# Patient Record
Sex: Male | Born: 1955 | Race: Black or African American | Hispanic: No | Smoking: Never smoker
Health system: Southern US, Community
[De-identification: ages and names within clinical notes are randomized; demographics above are authoritative.]

## PROBLEM LIST (undated history)

## (undated) DIAGNOSIS — M4800 Spinal stenosis, site unspecified: Secondary | ICD-10-CM

## (undated) DIAGNOSIS — M47816 Spondylosis without myelopathy or radiculopathy, lumbar region: Secondary | ICD-10-CM

## (undated) DIAGNOSIS — I10 Essential (primary) hypertension: Secondary | ICD-10-CM

## (undated) HISTORY — PX: CERVICAL FUSION: SHX112

---

## 1999-11-28 ENCOUNTER — Emergency Department (HOSPITAL_COMMUNITY): Admission: EM | Admit: 1999-11-28 | Discharge: 1999-11-28 | Payer: Self-pay | Admitting: Emergency Medicine

## 1999-11-28 ENCOUNTER — Encounter: Payer: Self-pay | Admitting: Emergency Medicine

## 1999-12-10 ENCOUNTER — Ambulatory Visit (HOSPITAL_COMMUNITY): Admission: RE | Admit: 1999-12-10 | Discharge: 1999-12-10 | Payer: Self-pay | Admitting: Family Medicine

## 1999-12-10 ENCOUNTER — Encounter: Payer: Self-pay | Admitting: Family Medicine

## 2000-04-17 ENCOUNTER — Encounter: Payer: Self-pay | Admitting: Family Medicine

## 2000-04-17 ENCOUNTER — Encounter: Admission: RE | Admit: 2000-04-17 | Discharge: 2000-04-17 | Payer: Self-pay | Admitting: Family Medicine

## 2000-09-17 ENCOUNTER — Ambulatory Visit (HOSPITAL_COMMUNITY): Admission: RE | Admit: 2000-09-17 | Discharge: 2000-09-17 | Payer: Self-pay | Admitting: *Deleted

## 2001-01-01 ENCOUNTER — Ambulatory Visit (HOSPITAL_COMMUNITY): Admission: RE | Admit: 2001-01-01 | Discharge: 2001-01-01 | Payer: Self-pay | Admitting: *Deleted

## 2013-09-08 DIAGNOSIS — M4802 Spinal stenosis, cervical region: Secondary | ICD-10-CM | POA: Insufficient documentation

## 2013-09-09 DIAGNOSIS — Z981 Arthrodesis status: Secondary | ICD-10-CM | POA: Insufficient documentation

## 2015-04-06 DIAGNOSIS — I10 Essential (primary) hypertension: Secondary | ICD-10-CM | POA: Insufficient documentation

## 2015-11-28 ENCOUNTER — Other Ambulatory Visit: Payer: Self-pay | Admitting: Orthopaedic Surgery

## 2015-11-28 DIAGNOSIS — M48061 Spinal stenosis, lumbar region without neurogenic claudication: Secondary | ICD-10-CM

## 2015-12-13 ENCOUNTER — Ambulatory Visit
Admission: RE | Admit: 2015-12-13 | Discharge: 2015-12-13 | Disposition: A | Discharge status: Home / Self Care | Payer: Medicare Other | Source: Ambulatory Visit | Attending: Orthopaedic Surgery | Admitting: Orthopaedic Surgery

## 2015-12-13 DIAGNOSIS — M48061 Spinal stenosis, lumbar region without neurogenic claudication: Secondary | ICD-10-CM

## 2015-12-23 ENCOUNTER — Other Ambulatory Visit: Payer: Self-pay

## 2016-01-13 ENCOUNTER — Other Ambulatory Visit: Payer: Self-pay

## 2017-02-20 IMAGING — MR MR LUMBAR SPINE W/O CM
4 of 5 series · 19 of 48 positions shown · non-contrast
Comparison: None.

CLINICAL DATA: Low back pain with pain, numbness and weakness in
both legs for more than 1 year. No acute injury or prior relevant
surgery.

EXAM:
MRI LUMBAR SPINE WITHOUT CONTRAST
TECHNIQUE: Multiplanar, multisequence MR imaging of the lumbar spine was
performed. No intravenous contrast was administered.

[Series 5: T2 · sagittal · 4.0mm · 0.68mm/px · 7 of 15 slices shown (1 of 2)]
[im 1/15]
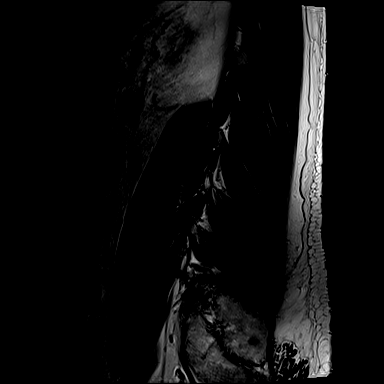
[im 3/15]
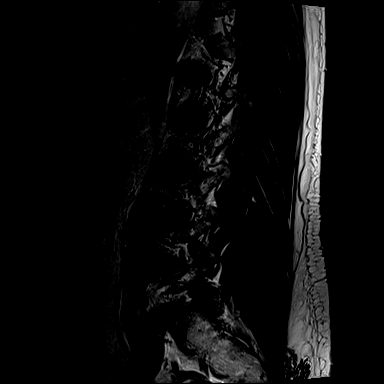
[im 5/15]
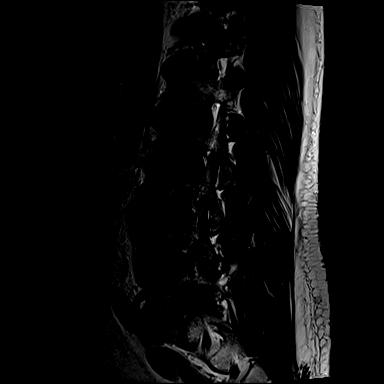
[im 8/15]
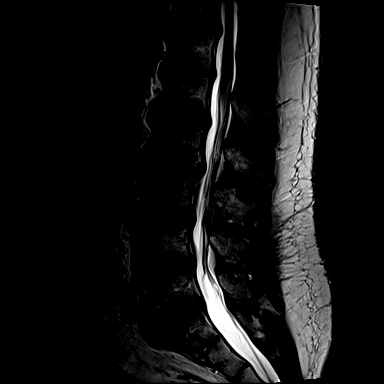
[im 10/15]
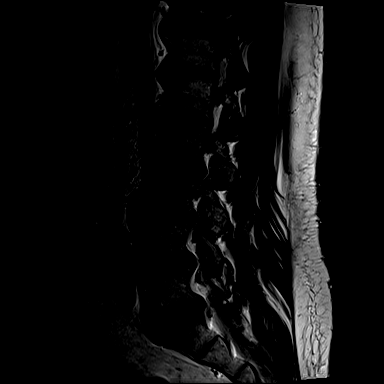
[im 12/15]
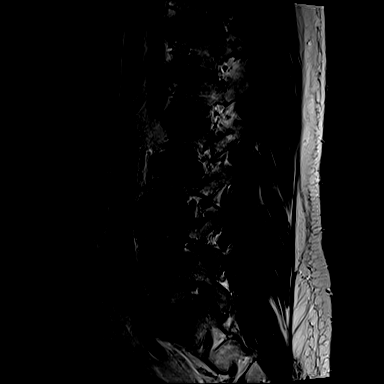
[im 15/15]
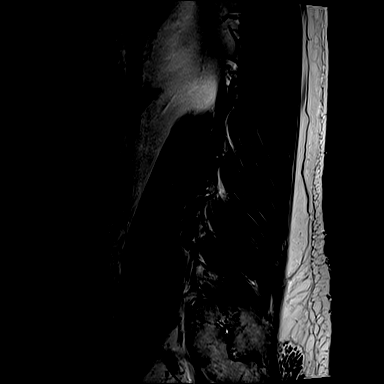

[Series 6: T1 · sagittal · 4.0mm · 0.81mm/px · 3 of 15 slices shown (1 of 2)]
[im 3/15]
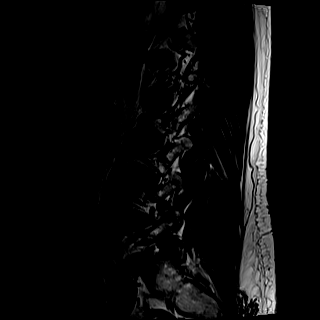
[im 8/15]
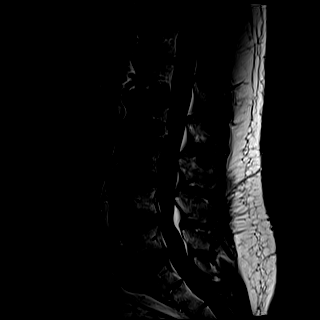
[im 12/15]
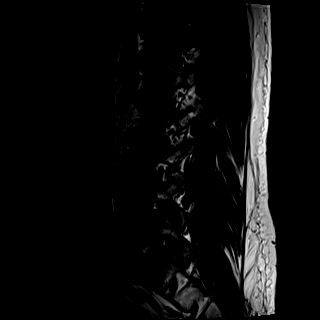

[Series 10: T1 · axial · 4.0mm · 0.28mm/px · z∈[-83,+45]mm · 3 of 33 slices shown (2 of 2)]
[im 5/33]
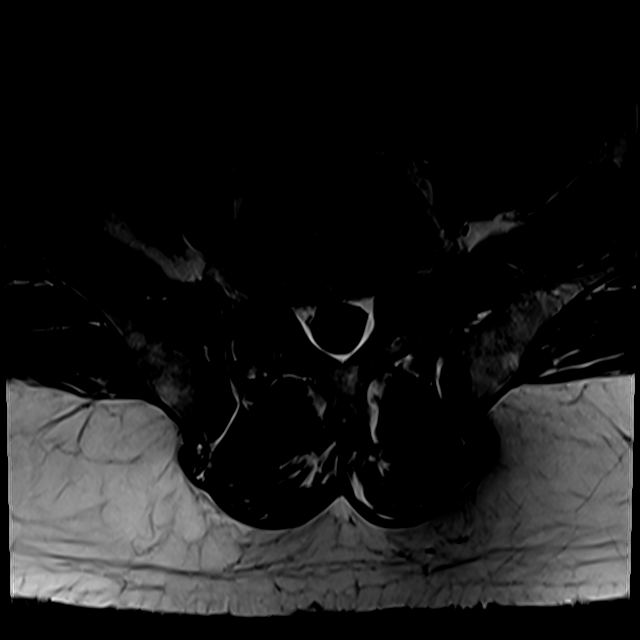
[im 18/33]
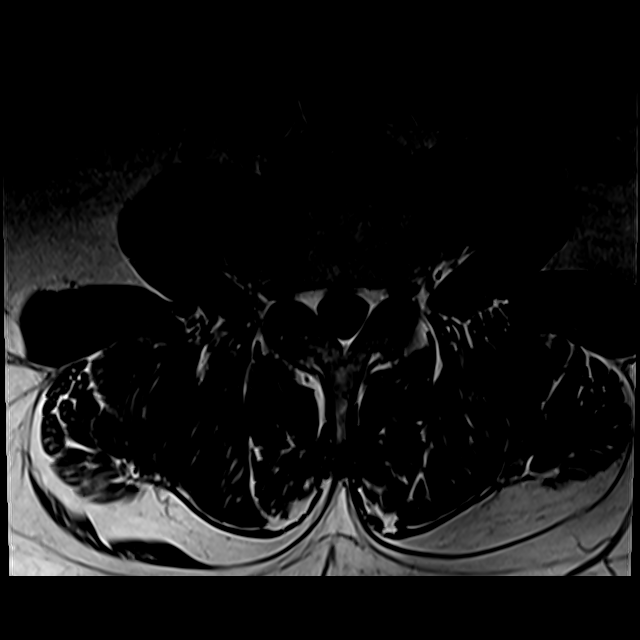
[im 28/33]
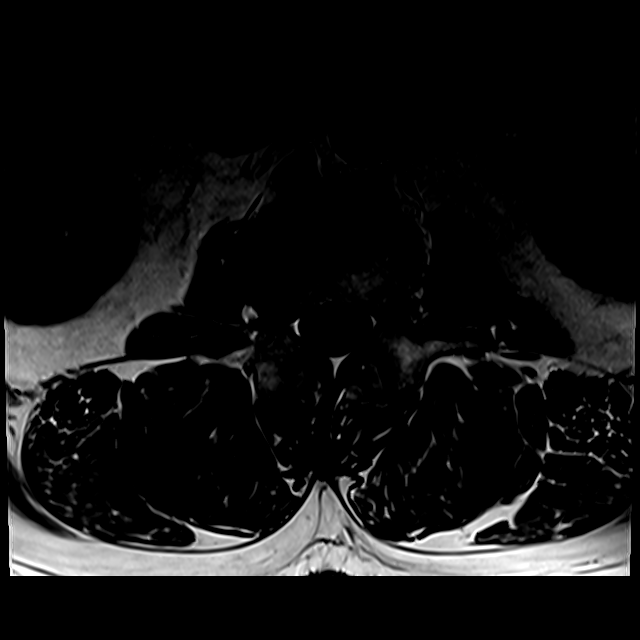

[Series 13: T2 · axial · 4.0mm · 0.28mm/px · z∈[-103,+45]mm · 6 of 33 slices shown (2 of 2)]
[im 1/33]
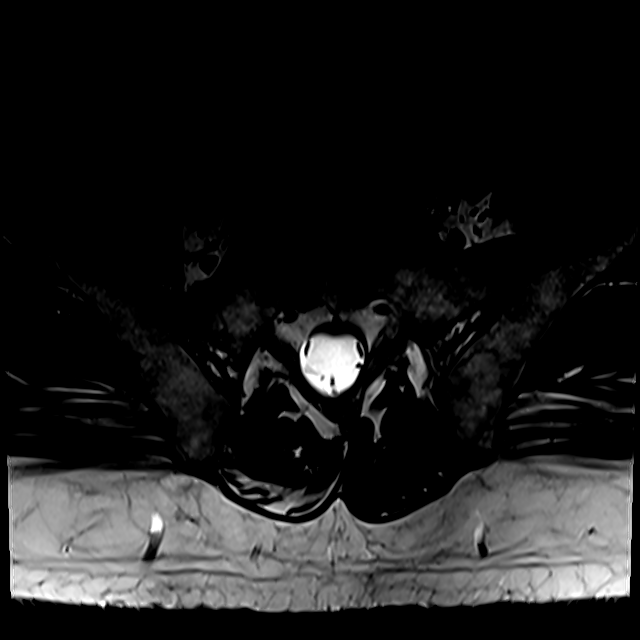
[im 5/33]
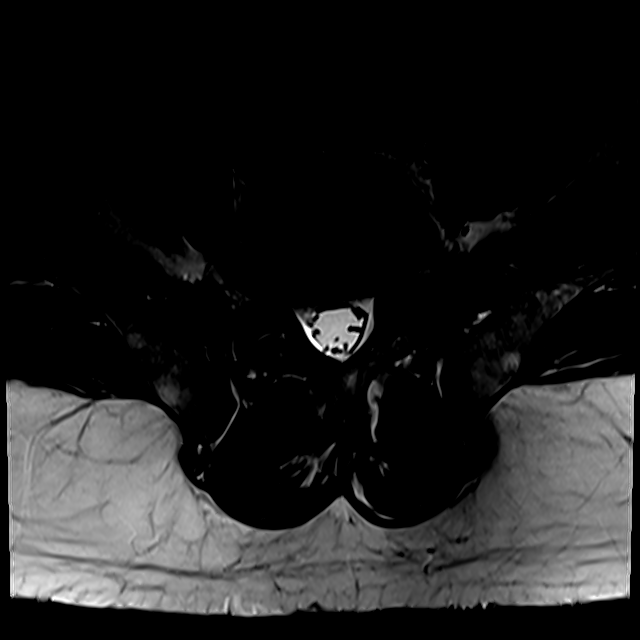
[im 10/33]
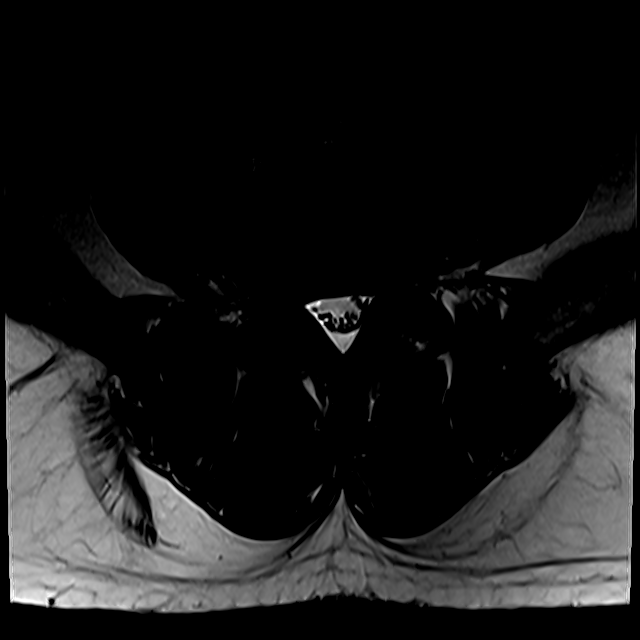
[im 15/33]
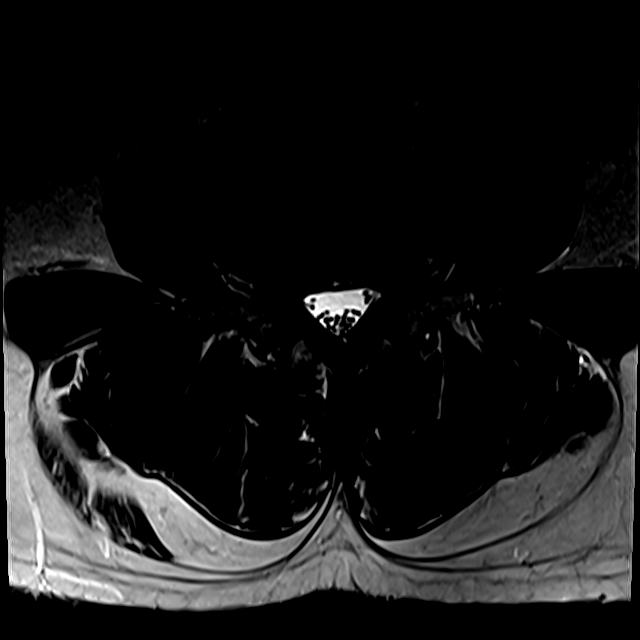
[im 18/33]
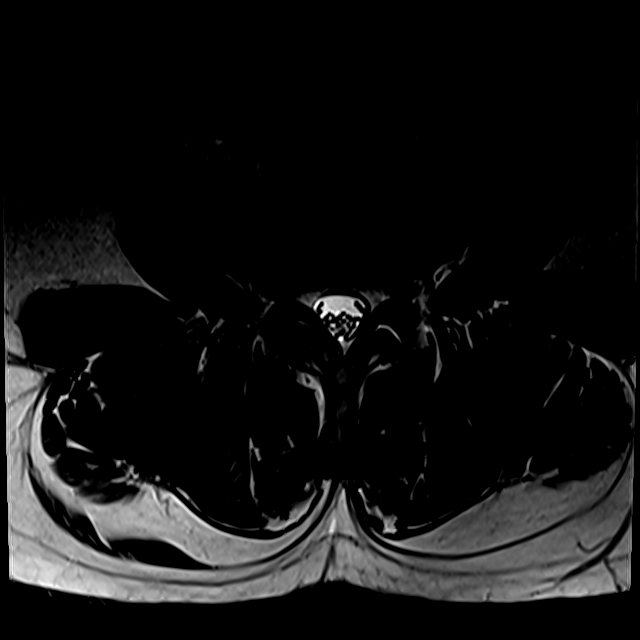
[im 28/33]
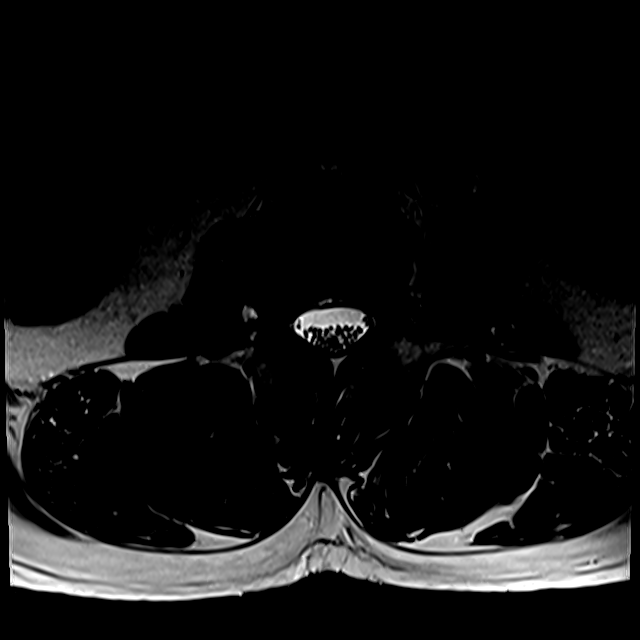

[19 of 48 positions shown; findings below may reference images not displayed]

FINDINGS: Segmentation: Conventional anatomy assumed, with the last open disc
space designated L5-S1.

Alignment: Minimal convex left scoliosis. No focal angulation or
significant listhesis.

Vertebrae: No worrisome osseous lesion, acute fracture or pars
defect. There are endplate degenerative changes which are most
advanced at L1-2 and L2-3. The lumbar pedicles are diffusely short
on a congenital basis. The left sacroiliac joint appears partially
ankylosed by anterior bridging osteophytes. The right sacroiliac
joint is not visualized.

Conus medullaris: Extends to the L1-2 level and appears normal.

Paraspinal and other soft tissues: No significant paraspinal
findings.

Disc levels:

Sagittal images demonstrate disc bulging with anterior osteophytes
at T11-12 and T12-L1. There is no cord deformity or significant
foraminal compromise.

L1-2: Mild loss of disc height with annular disc bulging and
endplate osteophytes. No significant spinal stenosis or nerve root
encroachment.

L2-3: There is loss of disc height with annular disc bulging and
endplate osteophytes. No significant spinal stenosis or nerve root
encroachment.

L3-4: Mild loss of disc height with annular disc bulging and
endplate osteophytes. Mild facet and ligamentous hypertrophy. There
is mild narrowing of the foramina without definite nerve root
encroachment.

L4-5: Mild disc bulging with preserved disc height. There is mild
facet and ligamentous hypertrophy. These factors contribute to mild
foraminal narrowing, left greater than right.

L5-S1: Moderate disc bulging with preserved disc height. Mild facet
and ligamentous hypertrophy. These factors contribute to mild right
and moderate left foraminal narrowing.
IMPRESSION: 1. There is multilevel degenerative disc disease superimposed on a
congenitally small canal. No significant central spinal stenosis
demonstrated.
2. Disc bulging and facet hypertrophy contribute to
left-greater-than-right foraminal narrowing inferiorly, worst on the
left at L5-S1.
3. No definite acute findings.

## 2017-11-11 ENCOUNTER — Ambulatory Visit (INDEPENDENT_AMBULATORY_CARE_PROVIDER_SITE_OTHER): Payer: Self-pay | Admitting: Orthopaedic Surgery

## 2020-02-17 DIAGNOSIS — M47816 Spondylosis without myelopathy or radiculopathy, lumbar region: Secondary | ICD-10-CM | POA: Insufficient documentation

## 2020-04-05 ENCOUNTER — Telehealth: Payer: Self-pay | Admitting: Specialist

## 2020-04-05 NOTE — Telephone Encounter (Signed)
Patient would like to see Dr.Nitka for his back.  Please review and advise

## 2020-04-06 NOTE — Telephone Encounter (Signed)
This patient would like to see you, but he has seen Dr. Ophelia Charter (Last OV was 01/17/2016--in [facility]) but his brother Matson Welch would like him to see you.  As far as it looks in the system he has not any recent imaging or care.  His last MRI that we have was done 12/13/2015. --Please advise

## 2020-04-07 ENCOUNTER — Ambulatory Visit (INDEPENDENT_AMBULATORY_CARE_PROVIDER_SITE_OTHER): Payer: Medicare Other

## 2020-04-07 ENCOUNTER — Encounter: Payer: Self-pay | Admitting: Specialist

## 2020-04-07 ENCOUNTER — Other Ambulatory Visit: Payer: Self-pay

## 2020-04-07 ENCOUNTER — Ambulatory Visit (INDEPENDENT_AMBULATORY_CARE_PROVIDER_SITE_OTHER): Payer: Medicare Other | Admitting: Specialist

## 2020-04-07 VITALS — BP 143/88 | HR 87 | Ht 70.5 in | Wt 235.0 lb

## 2020-04-07 DIAGNOSIS — M545 Low back pain, unspecified: Secondary | ICD-10-CM | POA: Diagnosis not present

## 2020-04-07 DIAGNOSIS — M5136 Other intervertebral disc degeneration, lumbar region: Secondary | ICD-10-CM

## 2020-04-07 DIAGNOSIS — Q761 Klippel-Feil syndrome: Secondary | ICD-10-CM

## 2020-04-07 DIAGNOSIS — M48062 Spinal stenosis, lumbar region with neurogenic claudication: Secondary | ICD-10-CM

## 2020-04-07 DIAGNOSIS — M4726 Other spondylosis with radiculopathy, lumbar region: Secondary | ICD-10-CM

## 2020-04-07 MED ORDER — AMITRIPTYLINE HCL 10 MG PO TABS: 10.0000 mg | ORAL_TABLET | Freq: Every day | ORAL | 0 refills | Status: DC

## 2020-04-07 MED ORDER — DICLOFENAC POTASSIUM 50 MG PO TABS: ORAL_TABLET | ORAL | 0 refills | Status: AC

## 2020-04-07 NOTE — Patient Instructions (Signed)
Plan: Avoid bending, stooping and avoid lifting weights greater than 10 lbs. Avoid prolong standing and walking. Avoid frequent bending and stooping  No lifting greater than 10 lbs. May use ice or moist heat for pain. Weight loss is of benefit. Handicap license is approved. Dr. Kosse Blas secretary/Assistant will call to arrange for epidural steroid injection  Diclofenac 50 mg po daily for one week then  BID with meal or snack. Amitriptyline 10 mg qhs Follow-Up Instructions: No follow-ups on file.

## 2020-04-07 NOTE — Progress Notes (Addendum)
Office Visit Note   Patient: Darin Hernandez           Date of Birth: [birth year]           MRN: [id number] Visit Date: 04/07/2020              Requested by: No referring provider defined for this encounter. PCP: No primary care provider on file.   Assessment & Plan: Visit Diagnoses:  1. Low back pain, unspecified back pain laterality, unspecified chronicity, unspecified whether sciatica present   2. Spinal stenosis of lumbar region with neurogenic claudication   3. Other spondylosis with radiculopathy, lumbar region   4. Degenerative disc disease, lumbar   5. 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 He has knife like pain into the right upper buttock that is  At times continuous. MRI shows impressive upper lumbar and lower thoracic DDD with congenital narrowing of the foramen due to shortened pedicles. There is severe foramenal narrowing present right L4-5 and L5-S1 due to disc bulge, overlying facet arthrosis. He points to the lower half of the lumbar spine and more in the small of his back as the primary area of pain with right PSIS pain that can be more typical of L5 nerve entrapment. Motor in sitting position is normal. Has relief that is only temporary with steroid dose pak. Will start diclofenac. He has already taken gabapentin and lyrica in the past without relief, I will add amitriptyline 10 mg at night. Advised to use a  Stationary bike and avoid prolong standing and avoid walking for exercise, may use a cane, pool exercise and walking may be better tolerated. Will refer for transforamenal ESI right L4 and L5.  Plan: Avoid bending, stooping and avoid lifting weights greater than 10 lbs. Avoid prolong standing and walking. Avoid frequent bending and stooping  No lifting  greater than 10 lbs. May use ice or moist heat for pain. Weight loss is of benefit. Handicap license is approved. Dr. Lehigh Blas secretary/Assistant will call to arrange for epidural steroid injection  Diclofenac 50 mg po daily for one week then  BID with meal or snack. Amitriptyline 10 mg qhs Follow-Up Instructions: Return in about 4 weeks (around 05/05/2020).   Orders:  Orders Placed This Encounter  Procedures  . XR Lumbar Spine 2-3 Views  . Ambulatory referral to Physical Medicine Rehab   No orders of the defined types were placed in this encounter.     Procedures: No procedures performed   Clinical Data: No additional findings.   Subjective: Chief Complaint  Patient presents with  . 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 He has been having back pain for about 5 years. Pain is a 24 of 10. Pain is sharp and like a knife. No memory of back pain or injury doing work. He is having pain now even with lying down. Pain is made worse with twisting or bending or lifting heavy items.  Raking or sweeping or mopping increases the pain. Just going to get up is painful. He has gained a few pound. Not a smoker. He has seen Dr. Ophelia Charter about his back  but he does not recall if there was anything he could do. He lives in [place] near [place], [place] and [place], [place]. He had  An MRI of his back in [facility] in [place] in 01/2020. He saw a neurosurgeon in [place] that told him that he couldn't help him. He went to pain management prior to neck condition, he had rods and screws placed but still hurts. No bowel or bladder issues. He has pain with walking and standing. He can not walk a long way without pain. Has difficulty reaching his shoes and socks, manages to reach them. He has difficulty going up and down stairs. He has to wait a moment or two when he first stands to be  able to walk.  He finds a place to sit when he stands or walks too long.   Review of Systems  Constitutional: Negative.  Negative for activity change, appetite change, chills (with bad pain), diaphoresis, fatigue, fever and unexpected weight change.  HENT: Positive for tinnitus. Negative for congestion, dental problem, drooling, ear discharge, ear pain, facial swelling, hearing loss, mouth sores, nosebleeds, postnasal drip, rhinorrhea, sinus pressure, sinus pain, sneezing, sore throat, trouble swallowing and voice change.   Eyes: Positive for visual disturbance. Negative for photophobia, pain, discharge, redness and itching.  Respiratory: Negative.  Negative for apnea, cough, choking, chest tightness, shortness of breath, wheezing and stridor.   Cardiovascular: Negative.  Negative for chest pain, palpitations and leg swelling.  Gastrointestinal: Negative.  Negative for abdominal distention, abdominal pain, anal bleeding, blood in stool, constipation, diarrhea, nausea, rectal pain and vomiting.  Endocrine: Negative for cold intolerance and heat intolerance.  Genitourinary: Negative.   Musculoskeletal: Positive for arthralgias, back pain and gait problem. Negative for joint swelling, myalgias, neck pain and neck stiffness.  Skin: Negative.  Negative for color change, pallor, rash and wound.  Allergic/Immunologic: Negative.  Negative for environmental allergies, food allergies and immunocompromised state.  Neurological: Positive for weakness and numbness. Negative for dizziness, tremors, seizures, syncope, facial asymmetry, speech difficulty, light-headedness and headaches.  Hematological: Negative.  Negative for adenopathy. Does not bruise/bleed easily.  Psychiatric/Behavioral: Negative.  Negative for agitation, behavioral problems, confusion, decreased concentration, dysphoric mood, hallucinations, self-injury, sleep disturbance and suicidal ideas. The patient is not nervous/anxious and is not  hyperactive.      Objective: Vital Signs: BP (!) 143/88 (BP Location: Left Arm, Patient Position: Sitting)   Pulse 87   Ht 5' 10.5" (1.791 m)   Wt 235 lb (106.6 kg)   BMI 33.24 kg/m   Physical Exam Constitutional:      Appearance: He is well-developed.  HENT:     Head: Normocephalic and atraumatic.  Eyes:     Pupils: Pupils are equal, round, and reactive to light.  Pulmonary:     Effort: Pulmonary effort is normal.     Breath sounds: Normal breath sounds.  Abdominal:     General: Bowel sounds are normal.     Palpations: Abdomen is soft.  Musculoskeletal:        General: Normal range of motion.     Cervical back: Normal range of motion and neck supple.  Skin:    General: Skin is warm and dry.  Neurological:     Mental Status: He is alert and oriented to person, place, and time.  Psychiatric:        Behavior: Behavior normal.        Thought Content: Thought content normal.        Judgment:  Judgment normal.     Ortho Exam  Specialty Comments:  No specialty comments available.  Imaging: No results found.   PMFS History: There are no problems to display for this patient.  No past medical history on file.  No family history on file.   Social History   Occupational History  . Not on file  Tobacco Use  . Smoking status: Never Smoker  . Smokeless tobacco: Never Used  Vaping Use  . Vaping Use: Never assessed  Substance and Sexual Activity  . Alcohol use: Yes    Alcohol/week: 4.0 standard drinks    Types: 4 Cans of beer per week    Comment: mostly on weekends.  . Drug use: Never  . Sexual activity: Not on file

## 2020-04-07 NOTE — Addendum Note (Signed)
Addended by: Vira Browns on: 04/07/2020 01:42 PM   Modules accepted: Orders

## 2020-05-01 ENCOUNTER — Ambulatory Visit (INDEPENDENT_AMBULATORY_CARE_PROVIDER_SITE_OTHER): Payer: Medicare Other | Admitting: Physical Medicine and Rehabilitation

## 2020-05-01 ENCOUNTER — Encounter: Payer: Self-pay | Admitting: Physical Medicine and Rehabilitation

## 2020-05-01 ENCOUNTER — Other Ambulatory Visit: Payer: Self-pay

## 2020-05-01 ENCOUNTER — Ambulatory Visit: Payer: Self-pay

## 2020-05-01 VITALS — BP 152/92 | HR 76

## 2020-05-01 DIAGNOSIS — M5416 Radiculopathy, lumbar region: Secondary | ICD-10-CM | POA: Diagnosis not present

## 2020-05-01 MED ORDER — METHYLPREDNISOLONE ACETATE 80 MG/ML IJ SUSP
80.0000 mg | Freq: Once | INTRAMUSCULAR | Status: AC
  Administered 2020-05-01: 80 mg

## 2020-05-01 NOTE — Progress Notes (Signed)
Darin Hernandez - 64 y.o. male MRN [id number]  Date of birth: 1956-04-04  Office Visit Note: Visit Date: 05/01/2020 PCP: No primary care provider on file. Referred by: Kerrin Champagne, MD  Subjective: Chief Complaint  Patient presents with  . Lower Back - Pain   HPI:  Darin Hernandez is a 64 y.o. male who comes in today at the request of Dr. Vira Browns for planned Right L4-L5 and L5-S1 Lumbar epidural steroid injection with fluoroscopic guidance.  The patient has failed conservative care including home exercise, medications, time and activity modification.  This injection will be diagnostic and hopefully therapeutic.  Please see requesting physician notes for further details and justification.   ROS Otherwise per HPI.  Assessment & Plan: Visit Diagnoses:  1. Lumbar radiculopathy     Plan: No additional findings.   Meds & Orders:  Meds ordered this encounter  Medications  . methylPREDNISolone acetate (DEPO-MEDROL) injection 80 mg    Orders Placed This Encounter  Procedures  . XR C-ARM NO REPORT  . Epidural Steroid injection    Follow-up: Return for visit to requesting physician as needed.   Procedures: No procedures performed  Lumbosacral Transforaminal Epidural Steroid Injection - Sub-Pedicular Approach with Fluoroscopic Guidance  Patient: Darin Hernandez      Date of Birth: 04/02/1956 MRN: [id number] PCP: No primary care provider on file.      Visit Date: 05/01/2020   Universal Protocol:    Date/Time: 05/01/2020  Consent Given By: the patient  Position: PRONE  Additional Comments: Vital signs were monitored before and after the procedure. Patient was prepped and draped in the usual sterile fashion. The correct patient, procedure, and site was verified.   Injection Procedure Details:   Procedure diagnoses:  1. Lumbar radiculopathy      Meds Administered:  Meds ordered this encounter  Medications  . methylPREDNISolone acetate (DEPO-MEDROL) injection 80 mg     Laterality: Right  Location/Site:  L4-L5 L5-S1  Needle:5.0 in., 22 ga.  Short bevel or Quincke spinal needle  Needle Placement: Transforaminal  Findings:    -Comments: Excellent flow of contrast along the nerve, nerve root and into the epidural space.  Procedure Details: After squaring off the end-plates to get a true AP view, the C-arm was positioned so that an oblique view of the foramen as noted above was visualized. The target area is just inferior to the "nose of the scotty dog" or sub pedicular. The soft tissues overlying this structure were infiltrated with 2-3 ml. of 1% Lidocaine without Epinephrine.  The spinal needle was inserted toward the target using a "trajectory" view along the fluoroscope beam.  Under AP and lateral visualization, the needle was advanced so it did not puncture dura and was located close the 6 O'Clock position of the pedical in AP tracterory. Biplanar projections were used to confirm position. Aspiration was confirmed to be negative for CSF and/or blood. A 1-2 ml. volume of Isovue-250 was injected and flow of contrast was noted at each level. Radiographs were obtained for documentation purposes.   After attaining the desired flow of contrast documented above, a 0.5 to 1.0 ml test dose of 0.25% Marcaine was injected into each respective transforaminal space.  The patient was observed for 90 seconds post injection.  After no sensory deficits were reported, and normal lower extremity motor function was noted,   the above injectate was administered so that equal amounts of the injectate were placed at each foramen (level) into the  transforaminal epidural space.   Additional Comments:  The patient tolerated the procedure well Dressing: 2 x 2 sterile gauze and Band-Aid    Post-procedure details: Patient was observed during the procedure. Post-procedure instructions were reviewed.  Patient left the clinic in stable condition.      Clinical  History: MRI LUMBAR SPINE WITHOUT CONTRAST  TECHNIQUE: Multiplanar, multisequence MR imaging of the lumbar spine was performed. No intravenous contrast was administered.  COMPARISON:  None.  FINDINGS: Segmentation: Conventional anatomy assumed, with the last open disc space designated L5-S1.  Alignment: Minimal convex left scoliosis. No focal angulation or significant listhesis.  Vertebrae: No worrisome osseous lesion, acute fracture or pars defect. There are endplate degenerative changes which are most advanced at L1-2 and L2-3. The lumbar pedicles are diffusely short on a congenital basis. The left sacroiliac joint appears partially ankylosed by anterior bridging osteophytes. The right sacroiliac joint is not visualized.  Conus medullaris: Extends to the L1-2 level and appears normal.  Paraspinal and other soft tissues: No significant paraspinal findings.  Disc levels:  Sagittal images demonstrate disc bulging with anterior osteophytes at T11-12 and T12-L1. There is no cord deformity or significant foraminal compromise.  L1-2: Mild loss of disc height with annular disc bulging and endplate osteophytes. No significant spinal stenosis or nerve root encroachment.  L2-3: There is loss of disc height with annular disc bulging and endplate osteophytes. No significant spinal stenosis or nerve root encroachment.  L3-4: Mild loss of disc height with annular disc bulging and endplate osteophytes. Mild facet and ligamentous hypertrophy. There is mild narrowing of the foramina without definite nerve root encroachment.  L4-5: Mild disc bulging with preserved disc height. There is mild facet and ligamentous hypertrophy. These factors contribute to mild foraminal narrowing, left greater than right.  L5-S1: Moderate disc bulging with preserved disc height. Mild facet and ligamentous hypertrophy. These factors contribute to mild right and moderate left foraminal  narrowing.  IMPRESSION: 1. 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 No significant central spinal stenosis demonstrated. 2. Disc bulging and facet hypertrophy contribute to left-greater-than-right foraminal narrowing inferiorly, worst on the left at L5-S1. 3. No definite acute findings.   Electronically Signed   By: Carey Bullocks M.D.   On: 12/13/2015 10:48     Objective:  VS:  HT:    WT:   BMI:     BP:(!) 152/92  HR:76bpm  TEMP: ( )  RESP:  Physical Exam Constitutional:      General: He is not in acute distress.    Appearance: Normal appearance. He is not ill-appearing.  HENT:     Head: Normocephalic and atraumatic.     Right Ear: External ear normal.     Left Ear: External ear normal.  Eyes:     Extraocular Movements: Extraocular movements intact.  Cardiovascular:     Rate and Rhythm: Normal rate.     Pulses: Normal pulses.  Abdominal:     General: There is no distension.     Palpations: Abdomen is soft.  Musculoskeletal:        General: No tenderness or signs of injury.     Right lower leg: No edema.     Left lower leg: No edema.     Comments: Patient has good distal strength without clonus.  Skin:    Findings: No erythema or rash.  Neurological:     General: No focal deficit present.     Mental Status: He is alert  and oriented to person, place, and time.     Sensory: No sensory deficit.     Motor: No weakness or abnormal muscle tone.     Coordination: Coordination normal.  Psychiatric:        Mood and Affect: Mood normal.        Behavior: Behavior normal.      Imaging: No results found.

## 2020-05-01 NOTE — Procedures (Signed)
Lumbosacral Transforaminal Epidural Steroid Injection - Sub-Pedicular Approach with Fluoroscopic Guidance  Patient: Darin Hernandez      Date of Birth: 12/22/1955 MRN: [id number] PCP: No primary care provider on file.      Visit Date: 05/01/2020   Universal Protocol:    Date/Time: 05/01/2020  Consent Given By: the patient  Position: PRONE  Additional Comments: Vital signs were monitored before and after the procedure. Patient was prepped and draped in the usual sterile fashion. The correct patient, procedure, and site was verified.   Injection Procedure Details:   Procedure diagnoses:  1. Lumbar radiculopathy      Meds Administered:  Meds ordered this encounter  Medications  . methylPREDNISolone acetate (DEPO-MEDROL) injection 80 mg    Laterality: Right  Location/Site:  L4-L5 L5-S1  Needle:5.0 in., 22 ga.  Short bevel or Quincke spinal needle  Needle Placement: Transforaminal  Findings:    -Comments: Excellent flow of contrast along the nerve, nerve root and into the epidural space.  Procedure Details: After squaring off the end-plates to get a true AP view, the C-arm was positioned so that an oblique view of the foramen as noted above was visualized. The target area is just inferior to the "nose of the scotty dog" or sub pedicular. The soft tissues overlying this structure were infiltrated with 2-3 ml. of 1% Lidocaine without Epinephrine.  The spinal needle was inserted toward the target using a "trajectory" view along the fluoroscope beam.  Under AP and lateral visualization, the needle was advanced so it did not puncture dura and was located close the 6 O'Clock position of the pedical in AP tracterory. Biplanar projections were used to confirm position. Aspiration was confirmed to be negative for CSF and/or blood. A 1-2 ml. volume of Isovue-250 was injected and flow of contrast was noted at each level. Radiographs were obtained for documentation purposes.   After  attaining the desired flow of contrast documented above, a 0.5 to 1.0 ml test dose of 0.25% Marcaine was injected into each respective transforaminal space.  The patient was observed for 90 seconds post injection.  After no sensory deficits were reported, and normal lower extremity motor function was noted,   the above injectate was administered so that equal amounts of the injectate were placed at each foramen (level) into the transforaminal epidural space.   Additional Comments:  The patient tolerated the procedure well Dressing: 2 x 2 sterile gauze and Band-Aid    Post-procedure details: Patient was observed during the procedure. Post-procedure instructions were reviewed.  Patient left the clinic in stable condition.

## 2020-05-01 NOTE — Progress Notes (Signed)
Pt state lower back pain. Pt state when he tries to do anything it makes his pain worse. Pt state he tries to sleep with pillows and take pain meds to help ease his pain.  Numeric Pain Rating Scale and Functional Assessment Average Pain 8   In the last MONTH (on 0-10 scale) has pain interfered with the following?  1. General activity like being  able to carry out your everyday physical activities such as walking, climbing stairs, carrying groceries, or moving a chair?  Rating(10)   +Driver, -BT, -Dye Allergies.

## 2020-05-06 ENCOUNTER — Other Ambulatory Visit: Payer: Self-pay | Admitting: Specialist

## 2020-05-11 ENCOUNTER — Encounter: Payer: Self-pay | Admitting: Specialist

## 2020-05-11 ENCOUNTER — Ambulatory Visit (INDEPENDENT_AMBULATORY_CARE_PROVIDER_SITE_OTHER): Payer: Medicare Other | Admitting: Specialist

## 2020-05-11 ENCOUNTER — Other Ambulatory Visit: Payer: Self-pay

## 2020-05-11 VITALS — BP 159/96 | HR 73 | Ht 70.5 in | Wt 235.0 lb

## 2020-05-11 DIAGNOSIS — M5136 Other intervertebral disc degeneration, lumbar region: Secondary | ICD-10-CM | POA: Diagnosis not present

## 2020-05-11 DIAGNOSIS — M4726 Other spondylosis with radiculopathy, lumbar region: Secondary | ICD-10-CM

## 2020-05-11 DIAGNOSIS — G8929 Other chronic pain: Secondary | ICD-10-CM

## 2020-05-11 DIAGNOSIS — M48062 Spinal stenosis, lumbar region with neurogenic claudication: Secondary | ICD-10-CM | POA: Diagnosis not present

## 2020-05-11 DIAGNOSIS — M533 Sacrococcygeal disorders, not elsewhere classified: Secondary | ICD-10-CM

## 2020-05-11 NOTE — Patient Instructions (Addendum)
Avoid bending, stooping and avoid lifting weights greater than 10 lbs. Avoid prolong standing and walking. Avoid frequent bending and stooping  No lifting greater than 10 lbs. May use ice or moist heat for pain. Weight loss is of benefit. Handicap license is approved. If the pain in the right hip starts worsening then call us and we would have Dr. Ramond Dial secretary/Assistant will call to arrange for right SI injection with with ultrasound.

## 2020-05-11 NOTE — Progress Notes (Signed)
Office Visit Note   Patient: Darin Hernandez           Date of Birth: [birth year]           MRN: [id number] Visit Date: 05/11/2020              Requested by: No referring provider defined for this encounter. PCP: No primary care provider on file.   Assessment & Plan: Visit Diagnoses:  1. Spinal stenosis of lumbar region with neurogenic claudication   2. Other spondylosis with radiculopathy, lumbar region   3. Degenerative disc disease, lumbar   4. Chronic right sacroiliac pain     Plan: Avoid bending, stooping and avoid lifting weights greater than 10 lbs. Avoid prolong standing and walking. Avoid frequent bending and stooping  No lifting greater than 10 lbs. May use ice or moist heat for pain. Weight loss is of benefit. Handicap license is approved. If the pain in the right hip starts worsening then call us and we would have Dr. Ramond Dial secretary/Assistant will call to arrange for right SI injection with with ultrasound.   Follow-Up Instructions: Return in about 4 weeks (around 06/08/2020).   Orders:  No orders of the defined types were placed in this encounter.  No orders of the defined types were placed in this encounter.     Procedures: No procedures performed   Clinical Data: No additional findings.   Subjective: Chief Complaint  Patient presents with  . Lower Back - Follow-up    Had a Rt L4-5, L5-S1 TF injection with Dr. Alvester Morin on 05/01/20, states that he can't I has done anything.    64 year old male with history of back and buttock pain, back pain is greater than leg pain. MRI in  2017 with disc degeneration. Radiographs with diffuse anterior lumbar syndesmophytes and DDD upper lumbar and T-L junction. MRI with foramenal narrowing  L4-5 and L5-S1, a transforaminal ESI done right L4-5 and L5-S1 with out any improvement in his pain pattern. He notes difficulty with  Frequency of urination and urgency. MRI doesn't show any central stenosis. There is mild  subarticular narrowing at the L2-3 level but not Nerve compression.   Review of Systems  Constitutional: Negative.   HENT: Negative.   Eyes: Negative.   Respiratory: Negative.   Cardiovascular: Negative.   Gastrointestinal: Negative.   Endocrine: Negative.   Genitourinary: Negative.   Musculoskeletal: Negative.   Skin: Negative.   Allergic/Immunologic: Negative.   Neurological: Negative.   Hematological: Negative.   Psychiatric/Behavioral: Negative.      Objective: Vital Signs: BP (!) 159/96 (BP Location: Left Arm, Patient Position: Sitting)   Pulse 73   Ht 5' 10.5" (1.791 m)   Wt 235 lb (106.6 kg)   BMI 33.24 kg/m   Physical Exam Constitutional:      Appearance: He is well-developed.  HENT:     Head: Normocephalic and atraumatic.  Eyes:     Pupils: Pupils are equal, round, and reactive to light.  Pulmonary:     Effort: Pulmonary effort is normal.     Breath sounds: Normal breath sounds.  Abdominal:     General: Bowel sounds are normal.     Palpations: Abdomen is soft.  Musculoskeletal:     Cervical back: Normal range of motion and neck supple.     Lumbar back: Negative right straight leg raise test and negative left straight leg raise test.  Skin:    General: Skin is warm and dry.  Neurological:  Mental Status: He is alert and oriented to person, place, and time.  Psychiatric:        Behavior: Behavior normal.        Thought Content: Thought content normal.        Judgment: Judgment normal.     Back Exam   Tenderness  The patient is experiencing tenderness in the sacroiliac and lumbar.  Range of Motion  Extension: abnormal  Flexion: abnormal  Lateral bend right: abnormal  Lateral bend left: abnormal  Rotation right: abnormal   Muscle Strength  Right Quadriceps:  5/5  Left Quadriceps:  5/5  Right Hamstrings:  5/5  Left Hamstrings:  5/5   Tests  Straight leg raise right: negative Straight leg raise left: negative  Reflexes  Patellar:  0/4 Achilles: 0/4 Babinski's sign: normal   Other  Toe walk: normal Heel walk: normal Gait: antalgic   Comments:  Figure of 4 right SI postive for replicating pain, hip is also stiff with decrease in right hip ER.       Specialty Comments:  No specialty comments available.  Imaging: No results found.   PMFS History: There are no problems to display for this patient.  No past medical history on file.  No family history on file.  No past surgical history on file. Social History   Occupational History  . Not on file  Tobacco Use  . Smoking status: Never Smoker  . Smokeless tobacco: Never Used  Vaping Use  . Vaping Use: Never assessed  Substance and Sexual Activity  . Alcohol use: Yes    Alcohol/week: 4.0 standard drinks    Types: 4 Cans of beer per week    Comment: mostly on weekends.  . Drug use: Never  . Sexual activity: Not on file

## 2020-09-25 ENCOUNTER — Telehealth: Payer: Self-pay

## 2020-09-25 NOTE — Telephone Encounter (Signed)
I spoke with the pt's sister who stated pt is still having a lot of pain and wanted to schedule pt for the procedure recommended last time pt saw Dr. Otelia Sergeant. Per last note this was to come in and see dr. Prince Rome for possible SI injection. I scheduled this appointment.

## 2020-09-28 ENCOUNTER — Ambulatory Visit: Payer: Medicare Other | Admitting: Family Medicine

## 2020-10-25 ENCOUNTER — Other Ambulatory Visit: Payer: Self-pay

## 2020-10-25 ENCOUNTER — Encounter: Payer: Self-pay | Admitting: Specialist

## 2020-10-25 ENCOUNTER — Ambulatory Visit (INDEPENDENT_AMBULATORY_CARE_PROVIDER_SITE_OTHER): Payer: Medicare Other | Admitting: Specialist

## 2020-10-25 VITALS — BP 133/77 | HR 86 | Ht 70.5 in | Wt 235.0 lb

## 2020-10-25 DIAGNOSIS — M5136 Other intervertebral disc degeneration, lumbar region: Secondary | ICD-10-CM

## 2020-10-25 DIAGNOSIS — M4816 Ankylosing hyperostosis [Forestier], lumbar region: Secondary | ICD-10-CM | POA: Diagnosis not present

## 2020-10-25 DIAGNOSIS — M4726 Other spondylosis with radiculopathy, lumbar region: Secondary | ICD-10-CM

## 2020-10-25 DIAGNOSIS — M48062 Spinal stenosis, lumbar region with neurogenic claudication: Secondary | ICD-10-CM | POA: Diagnosis not present

## 2020-10-25 MED ORDER — DICLOFENAC SODIUM 75 MG PO TBEC
75.0000 mg | DELAYED_RELEASE_TABLET | Freq: Two times a day (BID) | ORAL | 2 refills | Status: DC
Start: 2020-10-25 — End: 2022-01-14

## 2020-10-25 NOTE — Progress Notes (Signed)
Office Visit Note   Patient: Darin Hernandez           Date of Birth: 1956/06/09           MRN: [id number] Visit Date: 10/25/2020              Requested by: No referring provider defined for this encounter. PCP: No primary care provider on file.   Assessment & Plan: Visit Diagnoses:  1. Spinal stenosis of lumbar region with neurogenic claudication   2. Other spondylosis with radiculopathy, lumbar region   3. Degenerative disc disease, lumbar   4. Forestier's disease of lumbar region     Plan: Avoid bending, stooping and avoid lifting weights greater than 10 lbs. Avoid prolong standing and walking. Avoid frequent bending and stooping  No lifting greater than 10 lbs. May use ice or moist heat for pain. Weight loss is of benefit. Handicap license is approved. Best exercises are pool walking and stationary bicycle   Follow-Up Instructions: Return in about 3 months (around 01/25/2021).   Orders:  No orders of the defined types were placed in this encounter.  Meds ordered this encounter  Medications  . diclofenac (VOLTAREN) 75 MG EC tablet    Sig: Take 1 tablet (75 mg total) by mouth 2 (two) times daily.    Dispense:  180 tablet    Refill:  2      Procedures: No procedures performed   Clinical Data: No additional findings.   Subjective: Chief Complaint  Patient presents with  . Lower Back - Follow-up    Wants to discuss surgery    65 year old male with history of left back pain that is worse with standing and walking even short distances. 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 He has not had relief with either selective ESIs of the left L4-5 and L5-S1 transforamenal ESi or with left SI joint  Injection that was U/S guided.    Review of Systems  Constitutional: Negative.   HENT: Negative.   Eyes: Negative.   Respiratory: Negative.   Cardiovascular: Negative.    Gastrointestinal: Negative.   Endocrine: Negative.   Genitourinary: Negative.   Musculoskeletal: Negative.   Skin: Negative.   Allergic/Immunologic: Negative.   Neurological: Negative.   Hematological: Negative.   Psychiatric/Behavioral: Negative.      Objective: Vital Signs: BP 133/77 (BP Location: Left Arm, Patient Position: Sitting)   Pulse 86   Ht 5' 10.5" (1.791 m)   Wt 235 lb (106.6 kg)   BMI 33.24 kg/m   Physical Exam Constitutional:      Appearance: He is well-developed.  HENT:     Head: Normocephalic and atraumatic.  Eyes:     Pupils: Pupils are equal, round, and reactive to light.  Pulmonary:     Effort: Pulmonary effort is normal.     Breath sounds: Normal breath sounds.  Abdominal:     General: Bowel sounds are normal.     Palpations: Abdomen is soft.  Musculoskeletal:     Cervical back: Normal range of motion and neck supple.     Lumbar back: Positive right straight leg raise test and positive left straight leg raise test.  Skin:    General: Skin is warm and dry.  Neurological:     Mental Status: He is alert and oriented to person, place, and time.  Psychiatric:        Behavior:  Behavior normal.        Thought Content: Thought content normal.        Judgment: Judgment normal.     Back Exam   Tenderness  The patient is experiencing tenderness in the lumbar.  Range of Motion  Extension: abnormal  Flexion: abnormal  Lateral bend right: abnormal  Lateral bend left: abnormal  Rotation right: abnormal  Rotation left: abnormal   Muscle Strength  Right Quadriceps:  5/5  Left Quadriceps:  5/5  Right Hamstrings:  5/5  Left Hamstrings:  5/5   Tests  Straight leg raise right: positive Straight leg raise left: positive  Reflexes  Patellar: 0/4 Achilles: 0/4      Specialty Comments:  No specialty comments available.  Imaging: No results found.   PMFS History: There are no problems to display for this patient.  History reviewed.  No pertinent past medical history.  History reviewed. No pertinent family history.  History reviewed. No pertinent surgical history. Social History   Occupational History  . Not on file  Tobacco Use  . Smoking status: Never Smoker  . Smokeless tobacco: Never Used  Vaping Use  . Vaping Use: Not on file  Substance and Sexual Activity  . Alcohol use: Yes    Alcohol/week: 4.0 standard drinks    Types: 4 Cans of beer per week    Comment: mostly on weekends.  . Drug use: Never  . Sexual activity: Not on file

## 2020-10-25 NOTE — Patient Instructions (Signed)
Avoid bending, stooping and avoid lifting weights greater than 10 lbs. Avoid prolong standing and walking. Avoid frequent bending and stooping  No lifting greater than 10 lbs. May use ice or moist heat for pain. Weight loss is of benefit. Handicap license is approved. Best exercises are pool walking and stationary bicycle

## 2020-11-19 ENCOUNTER — Other Ambulatory Visit: Payer: Self-pay | Admitting: Specialist

## 2021-01-25 ENCOUNTER — Ambulatory Visit: Payer: Medicare Other | Admitting: Specialist

## 2022-01-13 ENCOUNTER — Other Ambulatory Visit: Payer: Self-pay | Admitting: Specialist

## 2022-12-10 ENCOUNTER — Ambulatory Visit
Admission: EM | Admit: 2022-12-10 | Discharge: 2022-12-10 | Disposition: A | Discharge status: Other Acute Inpatient Hospital | Payer: Medicare HMO | Attending: Family Medicine | Admitting: Family Medicine

## 2022-12-10 ENCOUNTER — Emergency Department (HOSPITAL_COMMUNITY)
Admission: EM | Admit: 2022-12-10 | Discharge: 2022-12-10 | Disposition: A | Discharge status: Home / Self Care | Payer: Medicare HMO | Attending: Emergency Medicine | Admitting: Emergency Medicine

## 2022-12-10 ENCOUNTER — Encounter (HOSPITAL_COMMUNITY): Payer: Self-pay

## 2022-12-10 ENCOUNTER — Other Ambulatory Visit: Payer: Self-pay

## 2022-12-10 ENCOUNTER — Emergency Department (HOSPITAL_COMMUNITY): Payer: Medicare HMO

## 2022-12-10 DIAGNOSIS — N3000 Acute cystitis without hematuria: Secondary | ICD-10-CM | POA: Diagnosis not present

## 2022-12-10 DIAGNOSIS — I10 Essential (primary) hypertension: Secondary | ICD-10-CM | POA: Insufficient documentation

## 2022-12-10 DIAGNOSIS — R32 Unspecified urinary incontinence: Secondary | ICD-10-CM

## 2022-12-10 DIAGNOSIS — R140 Abdominal distension (gaseous): Secondary | ICD-10-CM | POA: Diagnosis not present

## 2022-12-10 DIAGNOSIS — R319 Hematuria, unspecified: Secondary | ICD-10-CM | POA: Diagnosis not present

## 2022-12-10 DIAGNOSIS — R159 Full incontinence of feces: Secondary | ICD-10-CM

## 2022-12-10 DIAGNOSIS — R509 Fever, unspecified: Secondary | ICD-10-CM | POA: Diagnosis not present

## 2022-12-10 HISTORY — DX: Spondylosis without myelopathy or radiculopathy, lumbar region: M47.816

## 2022-12-10 HISTORY — DX: Essential (primary) hypertension: I10

## 2022-12-10 HISTORY — DX: Spinal stenosis, site unspecified: M48.00

## 2022-12-10 LAB — COMPREHENSIVE METABOLIC PANEL
ALT: 32 U/L (ref 0–44)
AST: 23 U/L (ref 15–41)
Albumin: 4.5 g/dL (ref 3.5–5.0)
Alkaline Phosphatase: 58 U/L (ref 38–126)
Anion gap: 9 (ref 5–15)
BUN: 13 mg/dL (ref 8–23)
CO2: 22 mmol/L (ref 22–32)
Calcium: 8.9 mg/dL (ref 8.9–10.3)
Chloride: 104 mmol/L (ref 98–111)
Creatinine, Ser: 1.06 mg/dL (ref 0.61–1.24)
GFR, Estimated: >60 mL/min (ref >60)
Glucose, Bld: 86 mg/dL (ref 70–99)
Potassium: 3.6 mmol/L (ref 3.5–5.1)
Sodium: 135 mmol/L (ref 135–145)
Total Bilirubin: 1.6 mg/dL — ABNORMAL HIGH (ref 0.3–1.2)
Total Protein: 7.8 g/dL (ref 6.5–8.1)

## 2022-12-10 LAB — URINALYSIS, W/ REFLEX TO CULTURE (INFECTION SUSPECTED)
Bilirubin Urine: NEGATIVE
Glucose, UA: NEGATIVE mg/dL
Ketones, ur: NEGATIVE mg/dL
Nitrite: POSITIVE — AB
Protein, ur: NEGATIVE mg/dL
RBC / HPF: >50 RBC/hpf (ref 0–5)
Specific Gravity, Urine: 1.015 (ref 1.005–1.030)
pH: 7 (ref 5.0–8.0)

## 2022-12-10 LAB — CBC
HCT: 44 % (ref 39.0–52.0)
Hemoglobin: 13.9 g/dL (ref 13.0–17.0)
MCH: 27.7 pg (ref 26.0–34.0)
MCHC: 31.6 g/dL (ref 30.0–36.0)
MCV: 87.6 fL (ref 80.0–100.0)
Platelets: 204 10*3/uL (ref 150–400)
RBC: 5.02 MIL/uL (ref 4.22–5.81)
RDW: 15.1 % (ref 11.5–15.5)
WBC: 10.4 10*3/uL (ref 4.0–10.5)
nRBC: 0 % (ref 0.0–0.2)

## 2022-12-10 LAB — POCT URINALYSIS DIP (MANUAL ENTRY)
Glucose, UA: NEGATIVE mg/dL
Nitrite, UA: POSITIVE — AB
Protein Ur, POC: >=300 mg/dL — AB
Spec Grav, UA: 1.025 (ref 1.010–1.025)
Urobilinogen, UA: 1 E.U./dL
pH, UA: 8.5 — AB (ref 5.0–8.0)

## 2022-12-10 LAB — LIPASE, BLOOD: Lipase: 24 U/L (ref 11–51)

## 2022-12-10 MED ORDER — SODIUM CHLORIDE 0.9 % IV SOLN
1.0000 g | Freq: Once | INTRAVENOUS | Status: AC
  Administered 2022-12-10: 1 g via INTRAVENOUS
  Filled 2022-12-10: qty 10

## 2022-12-10 MED ORDER — ONDANSETRON 4 MG PO TBDP
4.0000 mg | ORAL_TABLET | Freq: Once | ORAL | Status: AC
  Administered 2022-12-10: 4 mg via ORAL

## 2022-12-10 MED ORDER — SODIUM CHLORIDE 0.9 % IV BOLUS
1000.0000 mL | Freq: Once | INTRAVENOUS | Status: AC
  Administered 2022-12-10: 1000 mL via INTRAVENOUS

## 2022-12-10 MED ORDER — CEFUROXIME AXETIL 500 MG PO TABS: 500.0000 mg | ORAL_TABLET | Freq: Two times a day (BID) | ORAL | 0 refills | Status: AC

## 2022-12-10 MED ORDER — SODIUM CHLORIDE 0.9 % IV SOLN: INTRAVENOUS | Status: DC

## 2022-12-10 NOTE — ED Provider Notes (Signed)
[facility]    CSN: [id number] Arrival date & time: 12/10/22  1423      History   Chief Complaint Chief Complaint  Patient presents with   UTI Symptoms    HPI Darin Hernandez is a 67 y.o. male.   HPI Patient presents today with one day history of urine incontinence, bowel incontinence, and nausea. Patient medical history is only significant for or chronic lumbar back pain.  Patient denies any history of any previous prostate or urinary bladder problems. Reports that he is generally in good health and have never felt this poorly before.  Reports he has been urinating blood nearly every 10 minutes since yesterday. While here at  urgent care he has had an incontinent episode he is unable to control ability to urinate.  His abdomen is distended denies any overt abdominal pain but endorses bilateral flank pain. He is nauseous and having uncontrolled bowel movements which he reports are bloody.  Temperature on arrival was 100.2 F.  Due to nausea patient given 4 mg of Zofran.  Patient is complaining at present that he is experiencing 10/10 abdominal pain and crying  uncontrollably due to the level of pain.  History reviewed. No pertinent past medical history.  There are no problems to display for this patient.   History reviewed. No pertinent surgical history.     Home Medications    Prior to Admission medications   Medication Sig Start Date End Date Taking? Authorizing Provider  amitriptyline (ELAVIL) 10 MG tablet TAKE 1 TABLET(10 MG) BY MOUTH AT BEDTIME 11/21/20  Yes Kerrin Champagne, MD  diclofenac (VOLTAREN) 75 MG EC tablet TAKE 1 TABLET(75 MG) BY MOUTH TWICE DAILY 01/14/22   Kerrin Champagne, MD    Family History History reviewed. No pertinent family history.  Social History Social History   Tobacco Use   Smoking status: Never    Passive exposure: Never   Smokeless tobacco: Never  Vaping Use   Vaping Use: Never used  Substance Use Topics   Alcohol use: Yes     Alcohol/week: 4.0 standard drinks of alcohol    Types: 4 Cans of beer per week    Comment: mostly on weekends.   Drug use: Never     Allergies   Patient has no known allergies.   Review of Systems Review of Systems Pertinent negatives listed in HPI   Physical Exam Triage Vital Signs ED Triage Vitals  Enc Vitals Group     BP 12/10/22 1449 133/68     Pulse Rate 12/10/22 1449 (!) 104     Resp 12/10/22 1449 (!) 24     Temp 12/10/22 1449 100.2 F (37.9 C)     Temp Source 12/10/22 1449 Oral     SpO2 12/10/22 1449 97 %     Weight 12/10/22 1448 245 lb (111.1 kg)     Height 12/10/22 1448 5' 10.5" (1.791 m)     Head Circumference --      Peak Flow --      Pain Score 12/10/22 1447 10     Pain Loc --      Pain Edu? --      Excl. in GC? --    No data found.  Updated Vital Signs BP 133/68 (BP Location: Left Arm) Comment: Patient having uncontrolled shivering and moving.  Pulse (!) 104   Temp 100.2 F (37.9 C) (Oral)   Resp (!) 24   Ht 5' 10.5" (1.791 m)   Wt  245 lb (111.1 kg)   SpO2 97%   BMI 34.66 kg/m   Visual Acuity Right Eye Distance:   Left Eye Distance:   Bilateral Distance:    Right Eye Near:   Left Eye Near:    Bilateral Near:     Physical Exam Vitals reviewed.  Constitutional:      General: He is in acute distress.     Appearance: He is ill-appearing and toxic-appearing.  Cardiovascular:     Rate and Rhythm: Tachycardia present.  Pulmonary:     Effort: Tachypnea present.  Abdominal:     General: Bowel sounds are decreased. There is distension.     Tenderness: There is generalized abdominal tenderness. There is right CVA tenderness and left CVA tenderness.  Neurological:     Mental Status: He is alert.      UC Treatments / Results  Labs (all labs ordered are listed, but only abnormal results are displayed) Labs Reviewed  POCT URINALYSIS DIP (MANUAL ENTRY) - Abnormal; Notable for the following components:      Result Value   Color, UA red (*)     Clarity, UA cloudy (*)    Bilirubin, UA small (*)    Ketones, POC UA trace (5) (*)    Blood, UA large (*)    pH, UA 8.5 (*)    Protein Ur, POC >=300 (*)    Nitrite, UA Positive (*)    Leukocytes, UA Large (3+) (*)    All other components within normal limits    EKG   Radiology No results found.  Procedures Procedures (including critical care time)  Medications Ordered in UC Medications  ondansetron (ZOFRAN-ODT) disintegrating tablet 4 mg (4 mg Oral Given 12/10/22 1517)    Initial Impression / Assessment and Plan / UC Course  I have reviewed the triage vital signs and the nursing notes.  Pertinent labs & imaging results that were available during my care of the patient were reviewed by me and considered in my medical decision making (see chart for details).   UA significant for a urinary tract infection however patient has no history of BPH, diabetes, or any bladder dysfunction. Significance patient suffers from spinal stenosis but has no history of any bladder involvement. Patient complained of 10 out of 10 severe abdominal pain and urinary incontinence and bloody diarrhea, chills and fever, therefore EMS was called and transported patient to [facility] emergency department.  Given Zofran 4 mg for nausea prior to arrival of EMS.  Report given to EMS providers patient transported.  Patients family arrived here at urgent care prior to patient being transported via EMS, and is aware of where patient has been taken to [facility].  Final Clinical Impressions(s) / UC Diagnoses   Final diagnoses:  Fever, unspecified  Bowel and bladder incontinence  Hematuria, unspecified type   Discharge Instructions   None    ED Prescriptions   None    PDMP not reviewed this encounter.   Bing Neighbors, NP 12/10/22 [phone]

## 2022-12-10 NOTE — Discharge Instructions (Signed)
Take the antibiotics as prescribed.  Follow-up with your doctor to be rechecked to make sure the urinary tract infection clears.  Return to the ER for fevers chills worsening symptoms

## 2022-12-10 NOTE — ED Triage Notes (Signed)
Since yesterday about 11am "voiding uncontrollably". Now seeing "blood in my stool this morning, going about every 10 mins". Some nausea, No vomiting. No fever. "Head hurts some and can't stop shivering".

## 2022-12-10 NOTE — ED Notes (Signed)
Patient is being discharged from the Urgent Care and sent to the Emergency Department via EMS . Per [facility], patient is in need of higher level of care due to abd pain/fever/bloody urine. Patient is aware and verbalizes understanding of plan of care.  Vitals:   12/10/22 1449  BP: 133/68  Pulse: (!) 104  Resp: (!) 24  Temp: 100.2 F (37.9 C)  SpO2: 97%

## 2022-12-10 NOTE — Discharge Instructions (Addendum)
Transported via EMS to [facility] ED

## 2022-12-10 NOTE — ED Triage Notes (Addendum)
Pt BIBA from UC for abdominal pain. Urinalysis done at [facility]. Pt has had incontinence, hematuria since yesterday. 4 mg Zofran, 50 mcg Fentanyl given PTA

## 2022-12-10 NOTE — ED Provider Notes (Addendum)
[place] [facility]GENC[facility] DEPARTMENT AT [facility] Provider Note   CSN: [id number] Arrival date & time: 12/10/22  1611     History  Chief Complaint  Patient presents with   Abdominal Pain    Darin Hernandez is a 67 y.o. male.   Abdominal Pain    Patient has history of spinal stenosis, hypertension who presents to the ED for evaluation of hematuria and abdominal discomfort.  Patient states he started having symptoms last night.  He was having increased urinary frequency.  Following that he noticed gross blood in his urine.  Continued throughout the day.  He has been having some pain with urination primarily in the genitourinary region.  He denies any back pain.  No vomiting or diarrhea.  Patient went to an urgent care and was sent to the ED for further evaluation.  Home Medications Prior to Admission medications   Medication Sig Start Date End Date Taking? Authorizing Provider  cefUROXime (CEFTIN) 500 MG tablet Take 1 tablet (500 mg total) by mouth 2 (two) times daily with a meal for 7 days. 12/10/22 12/17/22 Yes Linwood Dibbles, MD  amitriptyline (ELAVIL) 10 MG tablet TAKE 1 TABLET(10 MG) BY MOUTH AT BEDTIME 11/21/20   Kerrin Champagne, MD  diclofenac (VOLTAREN) 75 MG EC tablet TAKE 1 TABLET(75 MG) BY MOUTH TWICE DAILY 01/14/22   Kerrin Champagne, MD      Allergies    Patient has no known allergies.    Review of Systems   Review of Systems  Gastrointestinal:  Positive for abdominal pain.    Physical Exam Updated Vital Signs BP 131/78   Pulse (!) 105   Temp 98.2 F (36.8 C) (Oral)   Resp 19   SpO2 97%  Physical Exam Vitals and nursing note reviewed.  Constitutional:      Appearance: He is well-developed. He is not diaphoretic.  HENT:     Head: Normocephalic and atraumatic.     Right Ear: External ear normal.     Left Ear: External ear normal.  Eyes:     General: No scleral icterus.       Right eye: No discharge.        Left eye: No discharge.     Conjunctiva/sclera:  Conjunctivae normal.  Neck:     Trachea: No tracheal deviation.  Cardiovascular:     Rate and Rhythm: Normal rate and regular rhythm.  Pulmonary:     Effort: Pulmonary effort is normal. No respiratory distress.     Breath sounds: Normal breath sounds. No stridor. No wheezing or rales.  Abdominal:     General: Bowel sounds are normal. There is no distension.     Palpations: Abdomen is soft.     Tenderness: There is no abdominal tenderness. There is no guarding or rebound.     Hernia: No hernia is present.  Musculoskeletal:        General: No tenderness or deformity.     Cervical back: Neck supple.  Skin:    General: Skin is warm and dry.     Findings: No rash.  Neurological:     General: No focal deficit present.     Mental Status: He is alert.     Cranial Nerves: No cranial nerve deficit, dysarthria or facial asymmetry.     Sensory: No sensory deficit.     Motor: No abnormal muscle tone or seizure activity.     Coordination: Coordination normal.  Psychiatric:  Mood and Affect: Mood normal.     ED Results / Procedures / Treatments   Labs (all labs ordered are listed, but only abnormal results are displayed) Labs Reviewed  COMPREHENSIVE METABOLIC PANEL - Abnormal; Notable for the following components:      Result Value   Total Bilirubin 1.6 (*)    All other components within normal limits  URINALYSIS, W/ REFLEX TO CULTURE (INFECTION SUSPECTED) - Abnormal; Notable for the following components:   APPearance HAZY (*)    Hgb urine dipstick LARGE (*)    Nitrite POSITIVE (*)    Leukocytes,Ua MODERATE (*)    Bacteria, UA MANY (*)    All other components within normal limits  URINE CULTURE  CBC  LIPASE, BLOOD    EKG None  Radiology CT Renal Stone Study  Result Date: 12/10/2022 CLINICAL DATA:  Abdominal pain.  Concern for kidney stone. EXAM: CT ABDOMEN AND PELVIS WITHOUT CONTRAST TECHNIQUE: Multidetector CT imaging of the abdomen and pelvis was performed following  the standard protocol without IV contrast. RADIATION DOSE REDUCTION: This exam was performed according to the departmental dose-optimization program which includes automated exposure control, adjustment of the mA and/or kV according to patient size and/or use of iterative reconstruction technique. COMPARISON:  None Available. FINDINGS: Evaluation of this exam is limited in the absence of intravenous contrast. Lower chest: The visualized lung bases are clear. No intra-abdominal free air or free fluid. Hepatobiliary: Fatty liver. There is a 2.3 cm cyst in the right lobe of the liver. No biliary dilatation. The gallbladder is unremarkable. Pancreas: Unremarkable. No pancreatic ductal dilatation or surrounding inflammatory changes. Spleen: Normal in size without focal abnormality. Adrenals/Urinary Tract: The adrenal glands are unremarkable. There is no hydronephrosis or nephrolithiasis on either side. Small bilateral renal cysts. No imaging follow-up. The visualized ureters and urinary bladder appear unremarkable. Stomach/Bowel: Small scattered colonic diverticula without active inflammatory changes. There is no bowel obstruction or active inflammation. The appendix is normal. Vascular/Lymphatic: Mild aortoiliac atherosclerotic disease. The IVC is unremarkable. No portal venous gas. There is no adenopathy. Reproductive: The prostate and seminal vesicles are grossly unremarkable. No pelvic mass. Other: None Musculoskeletal: Degenerative changes of the spine. No acute osseous pathology. IMPRESSION: 1. No acute intra-abdominal or pelvic pathology. No hydronephrosis or nephrolithiasis. 2. Fatty liver. 3. Colonic diverticulosis. No bowel obstruction. Normal appendix. 4.  Aortic Atherosclerosis (ICD10-I70.0). Electronically Signed   By: Elgie Collard M.D.   On: 12/10/2022 20:27    Procedures Procedures    Medications Ordered in ED Medications  sodium chloride 0.9 % bolus 1,000 mL (0 mLs Intravenous Stopped 12/10/22  1741)    And  0.9 %  sodium chloride infusion ( Intravenous New Bag/Given 12/10/22 1741)  cefTRIAXone (ROCEPHIN) 1 g in sodium chloride 0.9 % 100 mL IVPB (has no administration in time range)    ED Course/ Medical Decision Making/ A&P Clinical Course as of 12/10/22 2040  Tue Dec 10, 2022  1759 CBC normal.  Metabolic panel normal.  Lipase normal [JK]  1800 Bladder scan showed 29 cc of urine [JK]  2018 Urinalysis suggestive of infection [JK]  2034 CT scan does not show kidney stones or other acute abnormality [JK]    Clinical Course User Index [JK] Linwood Dibbles, MD                             Medical Decision Making Parental diagnosis includes but not limited to urinary tract infection pyelonephritis,  diverticulitis  Problems Addressed: Acute cystitis without hematuria: acute illness or injury that poses a threat to life or bodily functions  Amount and/or Complexity of Data Reviewed Labs: ordered. Decision-making details documented in ED Course. Radiology: ordered and independent interpretation performed.  Risk Prescription drug management.  Patient presented to the ED for evaluation of abdominal discomfort and hematuria.  Patient's laboratory test did not show any signs of leukocytosis.  No findings of hepatitis or pancreatitis.  His urinalysis is consistent with a urinary tract infection.  Patient's symptoms are also consistent with urinary tract infection.  With his hematuria CT was performed to rule out ureteral stones or other acute pathology.  CT scan without acute findings.  Patient has remained afebrile nontoxic.  He wants to go home.  Patient was given a dose of Rocephin in the ED.  Will discharge home on course of antibiotics.  Urine culture ordered.  Close outpatient follow-up with PCP.         Final Clinical Impression(s) / ED Diagnoses Final diagnoses:  Acute cystitis without hematuria    Rx / DC Orders ED Discharge Orders          Ordered    cefUROXime  (CEFTIN) 500 MG tablet  2 times daily with meals        12/10/22 2038              Linwood Dibbles, MD 12/10/22 2040 Patient noted to be mildly tachycardic.  No signs of systemic infection however.  He is otherwise feeling well and wants to go home.   Linwood Dibbles, MD 12/10/22 2129

## 2022-12-11 LAB — URINE CULTURE

## 2022-12-12 LAB — URINE CULTURE: Culture: >=100000 — AB

## 2022-12-13 ENCOUNTER — Telehealth (HOSPITAL_BASED_OUTPATIENT_CLINIC_OR_DEPARTMENT_OTHER): Payer: Self-pay | Admitting: *Deleted

## 2022-12-13 NOTE — Telephone Encounter (Signed)
Post ED Visit - Positive Culture Follow-up  Culture report reviewed by antimicrobial stewardship pharmacist: [facility] []  Enzo Bi, Pharm.D. []  Celedonio Miyamoto, Pharm.D., BCPS AQ-ID []  Garvin Fila, Pharm.D., BCPS []  Georgina Pillion, [address].D., BCPS []  [place], [address].D., BCPS, AAHIVP []  Estella Husk, Pharm.D., BCPS, AAHIVP []  Lysle Pearl, PharmD, BCPS []  Phillips Climes, PharmD, BCPS []  Agapito Games, PharmD, BCPS []  Verlan Friends, PharmD []  Mervyn Gay, PharmD, BCPS []  Vinnie Level, PharmD  [facility] [x]  Horris Latino PharmD []  Greer Pickerel, PharmD []  Adalberto Cole, PharmD []  Perlie Gold, Rph []  Lonell Face) Jean Rosenthal, PharmD []  Earl Many, PharmD []  Junita Push, PharmD []  Dorna Leitz, PharmD []  Terrilee Files, PharmD []  Lynann Beaver, PharmD []  Keturah Barre, PharmD []  Loralee Pacas, PharmD []  Bernadene Person, PharmD   Positive urine culture Treated with cefuroxime, organism sensitive to the same and no further patient follow-up is required at this time.  Nena Polio Garner Nash 12/13/2022, 10:24 AM
